# Patient Record
Sex: Male | Born: 2000 | Race: Black or African American | Hispanic: No | Marital: Single | Smoking: Never smoker
Health system: Southern US, Community
[De-identification: ages and names within clinical notes are randomized; demographics above are authoritative.]

---

## 2020-07-08 ENCOUNTER — Ambulatory Visit
Admission: RE | Admit: 2020-07-08 | Discharge: 2020-07-08 | Disposition: A | Discharge status: Home / Self Care | Payer: Self-pay | Source: Ambulatory Visit

## 2020-07-27 DIAGNOSIS — I82409 Acute embolism and thrombosis of unspecified deep veins of unspecified lower extremity: Secondary | ICD-10-CM

## 2020-07-27 HISTORY — DX: Acute embolism and thrombosis of unspecified deep veins of unspecified lower extremity: I82.409

## 2020-08-06 ENCOUNTER — Emergency Department: Payer: PRIVATE HEALTH INSURANCE

## 2020-08-06 ENCOUNTER — Other Ambulatory Visit: Payer: Self-pay

## 2020-08-06 ENCOUNTER — Emergency Department
Admission: EM | Admit: 2020-08-06 | Discharge: 2020-08-06 | Disposition: A | Discharge status: Home / Self Care | Payer: PRIVATE HEALTH INSURANCE | Attending: Emergency Medicine | Admitting: Emergency Medicine

## 2020-08-06 DIAGNOSIS — Z7901 Long term (current) use of anticoagulants: Secondary | ICD-10-CM | POA: Diagnosis not present

## 2020-08-06 DIAGNOSIS — I82452 Acute embolism and thrombosis of left peroneal vein: Secondary | ICD-10-CM | POA: Insufficient documentation

## 2020-08-06 DIAGNOSIS — Z20822 Contact with and (suspected) exposure to covid-19: Secondary | ICD-10-CM | POA: Diagnosis not present

## 2020-08-06 DIAGNOSIS — R21 Rash and other nonspecific skin eruption: Secondary | ICD-10-CM | POA: Diagnosis present

## 2020-08-06 LAB — COMPREHENSIVE METABOLIC PANEL
ALT: 20 U/L (ref 0–44)
AST: 34 U/L (ref 15–41)
Albumin: 4.8 g/dL (ref 3.5–5.0)
Alkaline Phosphatase: 61 U/L (ref 38–126)
Anion gap: 8 (ref 5–15)
BUN: 12 mg/dL (ref 6–20)
CO2: 30 mmol/L (ref 22–32)
Calcium: 9.7 mg/dL (ref 8.9–10.3)
Chloride: 101 mmol/L (ref 98–111)
Creatinine, Ser: 0.91 mg/dL (ref 0.61–1.24)
GFR, Estimated: >60 mL/min (ref >60)
Glucose, Bld: 91 mg/dL (ref 70–99)
Potassium: 4.2 mmol/L (ref 3.5–5.1)
Sodium: 139 mmol/L (ref 135–145)
Total Bilirubin: 0.7 mg/dL (ref 0.3–1.2)
Total Protein: 8.6 g/dL — ABNORMAL HIGH (ref 6.5–8.1)

## 2020-08-06 LAB — CBC WITH DIFFERENTIAL/PLATELET
Abs Immature Granulocytes: 0.03 10*3/uL (ref 0.00–0.07)
Basophils Absolute: 0 10*3/uL (ref 0.0–0.1)
Basophils Relative: 0 %
Eosinophils Absolute: 0 10*3/uL (ref 0.0–0.5)
Eosinophils Relative: 0 %
HCT: 47 % (ref 39.0–52.0)
Hemoglobin: 15.5 g/dL (ref 13.0–17.0)
Immature Granulocytes: 0 %
Lymphocytes Relative: 14 %
Lymphs Abs: 1.4 10*3/uL (ref 0.7–4.0)
MCH: 28.8 pg (ref 26.0–34.0)
MCHC: 33 g/dL (ref 30.0–36.0)
MCV: 87.4 fL (ref 80.0–100.0)
Monocytes Absolute: 0.4 10*3/uL (ref 0.1–1.0)
Monocytes Relative: 4 %
Neutro Abs: 8.5 10*3/uL — ABNORMAL HIGH (ref 1.7–7.7)
Neutrophils Relative %: 82 %
Platelets: 238 10*3/uL (ref 150–400)
RBC: 5.38 MIL/uL (ref 4.22–5.81)
RDW: 13.2 % (ref 11.5–15.5)
WBC: 10.4 10*3/uL (ref 4.0–10.5)
nRBC: 0 % (ref 0.0–0.2)

## 2020-08-06 LAB — RESPIRATORY PANEL BY RT PCR (FLU A&B, COVID)
Influenza A by PCR: NEGATIVE
Influenza B by PCR: NEGATIVE
SARS Coronavirus 2 by RT PCR: NEGATIVE

## 2020-08-06 MED ORDER — IOHEXOL 350 MG/ML SOLN
75.0000 mL | Freq: Once | INTRAVENOUS | Status: AC | PRN
  Administered 2020-08-06: 75 mL via INTRAVENOUS
  Filled 2020-08-06: qty 75

## 2020-08-06 MED ORDER — APIXABAN 5 MG PO TABS: ORAL_TABLET | ORAL | 6 refills | Status: AC

## 2020-08-06 NOTE — ED Provider Notes (Signed)
Emergency Department Provider Note  ____________________________________________  Time seen: Approximately 11:06 PM  I have reviewed the triage vital signs and the nursing notes.   HISTORY  Chief Complaint Rash (pt with rash to left lower leg only, itchy and painful)   Historian Patient   HPI Shawn Vargas is a 19 y.o. male presents to the emergency department with a rash along the left calf.  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  Patient states that after having seafood he noticed that his left calf appeared slightly more swollen than the right and he developed a papular, dry rash.  Patient states that he went to a local urgent care who gave him a steroid shot and started on prednisone.  He states that he has noticed no improvement in the rash or calf swelling.  Patient denies prolonged immobilization or recent surgery.  He denies daily smoking and has never had a DVT or PE.  He denies fever and chills.  He states that he has had cough for the past 2 to 3 days and has had 1-2 episodes of rust colored sputum.  Patient states that he has occasionally experience some shortness of breath.    No past medical history on file.   Immunizations up to date:  Yes.     No past medical history on file.  There are no problems to display for this patient.     Prior to Admission medications   Medication Sig Start Date End Date Taking? Authorizing Provider  apixaban (ELIQUIS) 5 MG TABS tablet Take 2 tablets morning and night for 7 days.  After 7 days take 1 tablet morning and night. 08/06/20   Orvil Feil, PA-C    Allergies Shellfish allergy  No family history on file.  Social History Social History   Tobacco Use   Smoking status: Not on file  Substance Use Topics   Alcohol use: Not on file   Drug use: Not on file     Review of Systems  Constitutional: No fever/chills Eyes:  No discharge ENT: No upper  respiratory complaints. Respiratory: no cough. No SOB/ use of accessory muscles to breath Gastrointestinal:   No nausea, no vomiting.  No diarrhea.  No constipation. Musculoskeletal: Patient has left calf pain.  Skin: Negative for rash, abrasions, lacerations, ecchymosis.   ____________________________________________   PHYSICAL EXAM:  VITAL SIGNS: ED Triage Vitals  Enc Vitals Group     BP 08/06/20 1859 (!) 147/86     Pulse Rate 08/06/20 1859 76     Resp 08/06/20 1859 (!) 24     Temp 08/06/20 1859 99.3 F (37.4 C)     Temp Source 08/06/20 1859 Oral     SpO2 08/06/20 1859 100 %     Weight 08/06/20 1900 217 lb (98.4 kg)     Height 08/06/20 1900 6\' 3"  (1.905 m)     Head Circumference --      Peak Flow --      Pain Score 08/06/20 1900 0     Pain Loc --      Pain Edu? --      Excl. in GC? --      Constitutional: Alert and oriented. Well appearing and in no acute distress. Eyes: Conjunctivae are normal. PERRL. EOMI. Head: Atraumatic. Cardiovascular: Normal rate, regular rhythm. Normal S1 and S2.  Good peripheral circulation. Respiratory: Normal respiratory effort without tachypnea or retractions. Lungs CTAB. Good air  entry to the bases with no decreased or absent breath sounds Gastrointestinal: Bowel sounds x 4 quadrants. Soft and nontender to palpation. No guarding or rigidity. No distention. Musculoskeletal: Full range of motion to all extremities. No obvious deformities noted Neurologic:  Normal for age. No gross focal neurologic deficits are appreciated.  Skin: Patient has dry, papular rash in an irregular distribution along lateral and anterior aspect of left calf.  Left calf does appear slightly larger than right with no pitting edema.  No increased warmth of the left calf in comparison with the right.  Palpable dorsalis pedis pulse bilaterally and symmetrically.  Capillary refill less than 2 seconds Psychiatric: Mood and affect are normal for age. Speech and behavior are  normal.   ____________________________________________   LABS (all labs ordered are listed, but only abnormal results are displayed)  Labs Reviewed  CBC WITH DIFFERENTIAL/PLATELET - Abnormal; Notable for the following components:      Result Value   Neutro Abs 8.5 (*)    All other components within normal limits  COMPREHENSIVE METABOLIC PANEL - Abnormal; Notable for the following components:   Total Protein 8.6 (*)    All other components within normal limits  RESPIRATORY PANEL BY RT PCR (FLU A&B, COVID)   ____________________________________________  EKG   ____________________________________________  RADIOLOGY Geraldo Pitter, personally viewed and evaluated these images (plain radiographs) as part of my medical decision making, as well as reviewing the written report by the radiologist.  DG Chest 2 View  Result Date: 08/06/2020 CLINICAL DATA:  19 year old male with calf swelling. EXAM: CHEST - 2 VIEW COMPARISON:  None. FINDINGS: The heart size and mediastinal contours are within normal limits. Both lungs are clear. The visualized skeletal structures are unremarkable. IMPRESSION: No active cardiopulmonary disease. Electronically Signed   By: Elgie Collard M.D.   On: 08/06/2020 20:58   CT Angio Chest PE W and/or Wo Contrast  Result Date: 08/06/2020 CLINICAL DATA:  Shortness of breath EXAM: CT ANGIOGRAPHY CHEST WITH CONTRAST TECHNIQUE: Multidetector CT imaging of the chest was performed using the standard protocol during bolus administration of intravenous contrast. Multiplanar CT image reconstructions and MIPs were obtained to evaluate the vascular anatomy. CONTRAST:  56mL OMNIPAQUE IOHEXOL 350 MG/ML SOLN COMPARISON:  None. FINDINGS: Cardiovascular: Contrast injection is sufficient to demonstrate satisfactory opacification of the pulmonary arteries to the segmental level. There is no pulmonary embolus or evidence of right heart strain. The size of the main pulmonary artery is  normal. Mild cardiomegaly. The course and caliber of the aorta are normal. There is no atherosclerotic calcification. Opacification decreased due to pulmonary arterial phase contrast bolus timing. Mediastinum/Nodes: -- No mediastinal lymphadenopathy. There is retrosternal soft tissues favored to represent residual thymic tissue. -- No hilar lymphadenopathy. -- No axillary lymphadenopathy. -- No supraclavicular lymphadenopathy. -- Normal thyroid gland where visualized. -  Unremarkable esophagus. Lungs/Pleura: Airways are patent. No pleural effusion, lobar consolidation, pneumothorax or pulmonary infarction. Upper Abdomen: Contrast bolus timing is not optimized for evaluation of the abdominal organs. The visualized portions of the organs of the upper abdomen are normal. Musculoskeletal: No chest wall abnormality. No bony spinal canal stenosis. Review of the MIP images confirms the above findings. IMPRESSION: 1. No evidence of pulmonary embolism or other acute intrathoracic process. 2. Mild cardiomegaly. Electronically Signed   By: Katherine Mantle M.D.   On: 08/06/2020 22:47   US Venous Img Lower Unilateral Left  Result Date: 08/06/2020 CLINICAL DATA:  Lower extremity swelling. EXAM: LEFT LOWER EXTREMITY VENOUS  DOPPLER ULTRASOUND TECHNIQUE: Gray-scale sonography with compression, as well as color and duplex ultrasound, were performed to evaluate the deep venous system(s) from the level of the common femoral vein through the popliteal and proximal calf veins. COMPARISON:  None. FINDINGS: VENOUS Normal compressibility of the common femoral, superficial femoral, and popliteal veins. The left posterior tibial vein is patent. There is occlusive thrombus within the left peroneal vein. Visualized portions of profunda femoral vein and great saphenous vein unremarkable. No filling defects to suggest DVT on grayscale or color Doppler imaging. Doppler waveforms show normal direction of venous flow, normal respiratory  plasticity and response to augmentation. Limited views of the contralateral common femoral vein are unremarkable. OTHER None. Limitations: none IMPRESSION: Study is positive for an acute DVT involving the left peroneal vein. Electronically Signed   By: Katherine Mantle M.D.   On: 08/06/2020 22:06    ____________________________________________    PROCEDURES  Procedure(s) performed:     Procedures     Medications  iohexol (OMNIPAQUE) 350 MG/ML injection 75 mL (75 mLs Intravenous Contrast Given 08/06/20 2231)     ____________________________________________   INITIAL IMPRESSION / ASSESSMENT AND PLAN / ED COURSE  Pertinent labs & imaging results that were available during my care of the patient were reviewed by me and considered in my medical decision making (see chart for details).  Clinical Course as of Aug 06 2305  Thu Aug 06, 2020  2232 CT Angio Chest PE W and/or Wo Contrast [JW]    Clinical Course User Index [JW] Orvil Feil, PA-C     Assessment and plan DVT 19 year old male presents to the emergency department with a dry rash and mild left calf swelling for the past 4 to 5 days.  Patient was tachypneic at triage but vital signs were otherwise reassuring.  Differential diagnosis included contact dermatitis, DVT, allergic reaction, folliculitis...  CBC and CMP were reassuring.  Patient was COVID-19 negative.  Obtain a venous ultrasound his left calf was slightly larger than right which revealed an occlusive thrombus in the left peroneal vein.  Because patient was having occasional shortness of breath and cough, obtain CTA which revealed no evidence of PE.  Patient was started on Eliquis.  I discussed patient's case with attending, Dr. Lenard Lance and we agreed to have patient follow-up with hematology oncology for further testing to identify underlying cause of DVT as patient does not have typical risk factors.  Return precautions were given to return with new  or worsening symptoms.  All patient questions were answered.    ____________________________________________  FINAL CLINICAL IMPRESSION(S) / ED DIAGNOSES  Final diagnoses:  Acute deep vein thrombosis (DVT) of left peroneal vein (HCC)      NEW MEDICATIONS STARTED DURING THIS VISIT:  ED Discharge Orders         Ordered    apixaban (ELIQUIS) 5 MG TABS tablet        08/06/20 2302              This chart was dictated using voice recognition software/Dragon. Despite best efforts to proofread, errors can occur which can change the meaning. Any change was purely unintentional.      Orvil Feil, PA-C 08/06/20 2311    Minna Antis, MD 08/06/20 [phone]

## 2020-08-06 NOTE — ED Notes (Signed)
Patient transported to CT 

## 2020-08-06 NOTE — Discharge Instructions (Addendum)
You are going to take 2 tablets of Eliquis in the morning and 2 tablets of Eliquis at night for the first 7 days.  After 7 days take 1 tablet of Eliquis in the morning and 1 tablet of Eliquis at night. Please make follow-up appointment with hematology.

## 2020-08-06 NOTE — ED Triage Notes (Signed)
Pt with possible exposure to seafood, pt has severe seafood allergy. Pt states his throat and eyes swell with exposure. Pt states he had possible exposure last Thursday, had SOB and hives, went to urgent care and got steroid shot and prednisone. Pt states he still feels SOB and has hives. Pt thinks he coughed up blood this morning.

## 2020-08-07 ENCOUNTER — Other Ambulatory Visit: Payer: Self-pay

## 2020-08-07 ENCOUNTER — Encounter: Payer: Self-pay | Admitting: Emergency Medicine

## 2020-08-07 DIAGNOSIS — M79622 Pain in left upper arm: Secondary | ICD-10-CM | POA: Insufficient documentation

## 2020-08-07 DIAGNOSIS — Z7901 Long term (current) use of anticoagulants: Secondary | ICD-10-CM | POA: Diagnosis not present

## 2020-08-07 DIAGNOSIS — Z86718 Personal history of other venous thrombosis and embolism: Secondary | ICD-10-CM | POA: Insufficient documentation

## 2020-08-07 NOTE — ED Triage Notes (Signed)
Pt presents to ER with complaints of left arm pain. Pt reports he developed pain today reports diagnosed yesterday with blood clot to left lower leg. Reports took Eliquis first dose prior to arrival. Pt has palpable pulse to left wrist. Pt talks in complete sentences no distress noted

## 2020-08-08 ENCOUNTER — Emergency Department
Admission: EM | Admit: 2020-08-08 | Discharge: 2020-08-08 | Disposition: A | Discharge status: Home / Self Care | Payer: PRIVATE HEALTH INSURANCE | Attending: Emergency Medicine | Admitting: Emergency Medicine

## 2020-08-08 ENCOUNTER — Emergency Department: Payer: PRIVATE HEALTH INSURANCE

## 2020-08-08 DIAGNOSIS — S46212A Strain of muscle, fascia and tendon of other parts of biceps, left arm, initial encounter: Secondary | ICD-10-CM

## 2020-08-08 DIAGNOSIS — M7989 Other specified soft tissue disorders: Secondary | ICD-10-CM

## 2020-08-08 DIAGNOSIS — M79622 Pain in left upper arm: Secondary | ICD-10-CM

## 2020-08-08 NOTE — ED Provider Notes (Signed)
[facility] Emergency Department Provider Note   ____________________________________________   First MD Initiated Contact with Patient 08/08/20 [phone]     (approximate)  I have reviewed the triage vital signs and the nursing notes.   HISTORY  Chief Complaint Arm Pain (Left )    HPI Shawn Vargas is a 19 y.o. male with past medical history of DVT on Eliquis who presents to the ED complaining of arm pain.  Patient reports that over the past 12 hours he has noticed some pain and swelling in his left arm.  Symptoms are similar to what he has recently been experiencing in his left leg, and he was diagnosed with DVT in his left lower extremity on recent ED visit.  He had CTA at that time that was negative for PE, states he has been compliant with Eliquis since discharge home.  He denies any trauma to his left arm, has not had any recent heavy lifting.  Pain is primarily over the area of his biceps when he goes to flex his left elbow.  He has not noticed any wounds or drainage.        History reviewed. No pertinent past medical history.  There are no problems to display for this patient.   History reviewed. No pertinent surgical history.  Prior to Admission medications   Medication Sig Start Date End Date Taking? Authorizing Provider  apixaban (ELIQUIS) 5 MG TABS tablet Take 2 tablets morning and night for 7 days.  After 7 days take 1 tablet morning and night. 08/06/20   Orvil Feil, PA-C    Allergies Shellfish allergy  No family history on file.  Social History Social History   Tobacco Use  . Smoking status: Never Smoker  Substance Use Topics  . Alcohol use: Not on file  . Drug use: Not on file    Review of Systems  Constitutional: No fever/chills Eyes: No visual changes. ENT: No sore throat. Cardiovascular: Denies chest pain. Respiratory: Denies shortness of breath. Gastrointestinal: No abdominal pain.  No nausea, no vomiting.  No  diarrhea.  No constipation. Genitourinary: Negative for dysuria. Musculoskeletal: Negative for back pain.  Positive for left arm pain. Skin: Negative for rash. Neurological: Negative for headaches, focal weakness or numbness.  ____________________________________________   PHYSICAL EXAM:  VITAL SIGNS: ED Triage Vitals  Enc Vitals Group     BP 08/07/20 2356 130/67     Pulse Rate 08/07/20 2356 64     Resp 08/07/20 2356 20     Temp 08/07/20 2356 98.9 F (37.2 C)     Temp Source 08/07/20 2356 Oral     SpO2 08/07/20 2356 97 %     Weight 08/07/20 2357 217 lb (98.4 kg)     Height 08/07/20 2357 6\' 3"  (1.905 m)     Head Circumference --      Peak Flow --      Pain Score 08/07/20 2356 6     Pain Loc --      Pain Edu? --      Excl. in GC? --     Constitutional: Alert and oriented. Eyes: Conjunctivae are normal. Head: Atraumatic. Nose: No congestion/rhinnorhea. Mouth/Throat: Mucous membranes are moist. Neck: Normal ROM Cardiovascular: Normal rate, regular rhythm. Grossly normal heart sounds. Respiratory: Normal respiratory effort.  No retractions. Lungs CTAB. Gastrointestinal: Soft and nontender. No distention. Genitourinary: deferred Musculoskeletal: No lower extremity tenderness nor edema.  Tenderness to palpation over left bicep, no associated erythema, warmth, or fluctuance.  Range of motion to bilateral upper and lower extremities intact without pain. Neurologic:  Normal speech and language. No gross focal neurologic deficits are appreciated. Skin:  Skin is warm, dry and intact. No rash noted. Psychiatric: Mood and affect are normal. Speech and behavior are normal.  ____________________________________________   LABS (all labs ordered are listed, but only abnormal results are displayed)  Labs Reviewed - No data to display   PROCEDURES  Procedure(s) performed (including Critical Care):  Procedures   ____________________________________________   INITIAL  IMPRESSION / ASSESSMENT AND PLAN / ED COURSE       19 year old male with possible history of DVT on Eliquis who presents to the ED complaining of left arm pain and swelling for the past 12 hours.  He is primarily concerned for DVT in his left arm as he was recently diagnosed with 1 in his left lower extremity.  Ultrasound was performed and negative for DVT.  No evidence of traumatic injury to his left arm and no signs of abscess or cellulitis.  He does have tenderness at his left bicep as well as pain with left elbow flexion.  This appears most consistent with a bicep strain and patient is appropriate for discharge home with hematology and PCP follow-up.  He was counseled to avoid NSAIDs given he is anticoagulated, but may take Tylenol as needed and apply ice.  He was counseled to return to the ED for new or worsening symptoms, patient agrees with plan.      ____________________________________________   FINAL CLINICAL IMPRESSION(S) / ED DIAGNOSES  Final diagnoses:  Left upper arm pain  Left arm swelling     ED Discharge Orders    None       Note:  This document was prepared using Dragon voice recognition software and may include unintentional dictation errors.   Chesley Noon, MD 08/08/20 [phone]

## 2020-08-12 ENCOUNTER — Inpatient Hospital Stay: Payer: PRIVATE HEALTH INSURANCE | Admitting: Oncology

## 2020-08-12 ENCOUNTER — Inpatient Hospital Stay: Payer: PRIVATE HEALTH INSURANCE

## 2020-08-13 ENCOUNTER — Other Ambulatory Visit: Payer: Self-pay

## 2020-08-13 ENCOUNTER — Encounter: Payer: Self-pay | Admitting: Oncology

## 2020-08-13 ENCOUNTER — Inpatient Hospital Stay: Payer: PRIVATE HEALTH INSURANCE | Attending: Oncology | Admitting: Oncology

## 2020-08-13 ENCOUNTER — Inpatient Hospital Stay: Payer: PRIVATE HEALTH INSURANCE

## 2020-08-13 VITALS — BP 123/77 | HR 61 | Temp 96.2°F | Resp 16 | Wt 218.0 lb

## 2020-08-13 DIAGNOSIS — D6861 Antiphospholipid syndrome: Secondary | ICD-10-CM | POA: Diagnosis not present

## 2020-08-13 DIAGNOSIS — R21 Rash and other nonspecific skin eruption: Secondary | ICD-10-CM

## 2020-08-13 DIAGNOSIS — Z7901 Long term (current) use of anticoagulants: Secondary | ICD-10-CM | POA: Diagnosis not present

## 2020-08-13 DIAGNOSIS — I82452 Acute embolism and thrombosis of left peroneal vein: Secondary | ICD-10-CM

## 2020-08-13 DIAGNOSIS — D6851 Activated protein C resistance: Secondary | ICD-10-CM | POA: Insufficient documentation

## 2020-08-13 LAB — HEPATITIS PANEL, ACUTE
HCV Ab: NONREACTIVE
Hep A IgM: NONREACTIVE
Hep B C IgM: NONREACTIVE
Hepatitis B Surface Ag: NONREACTIVE

## 2020-08-13 LAB — HIV ANTIBODY (ROUTINE TESTING W REFLEX): HIV Screen 4th Generation wRfx: NONREACTIVE

## 2020-08-13 NOTE — Progress Notes (Signed)
Hematology/Oncology Consult note [facility] Telephone:([phone] [phone]   Patient Care Team: Patient, No Pcp Per as PCP - General (General Practice)  REFERRING PROVIDER: Minna Antis, MD  CHIEF COMPLAINTS/REASON FOR VISIT:  Evaluation of left lower extremity acute DVT  HISTORY OF PRESENTING ILLNESS:   Shawn Vargas is a  19 y.o.  male with PMH listed below was seen in consultation at the request of  Minna Antis, MD  for evaluation of left lower extremity acute DVT  08/02/2020, patient developed rash of left lower extremity.  Initially he thought was allergic reaction after eating past which initially was thought to be secondary to allergic reaction from seafood.  Patient went to urgent care and had steroid shot. 08/06/2020 patient went to emergency room for evaluation of left calf swelling and the pain.  Patient also had a cough for about 2 to 3 days and noticed 1-2 episodes of rust colored sputum. Left lower extremity venous ultrasound showed occlusive thrombus in the left peroneal vein.   CT angiogram showed no pulmonary embolism.  Mild cardiomegaly Patient denies any trauma, recent immobilization events. Patient was started on Eliquis starter kit. 08/08/2020, patient presented emergency room again after noticing left upper extremity rash.  Venous ultrasound of the left upper extremity showed no DVT.  Patient was referred to hematology for further evaluation.  Today patient reports being on Eliquis 10 mg twice daily.  He is about to finish the initial course and going to switch to Eliquis 5 mg twice daily. He reports tolerating treatments very well.  No bleeding.  Left lower extremity calf pain and swelling has slightly improved.  Rash is persist.  He denies any family history or personal history of VTE.  Denies smoking. He has large area of tattoo on his left lower extremity as well as left upper extremity.  Some area of the tattoo  was recently done approximately a month ago.   Review of Systems  Constitutional: Negative for appetite change, chills, fatigue, fever and unexpected weight change.  HENT:   Negative for hearing loss and voice change.   Eyes: Negative for eye problems and icterus.  Respiratory: Negative for chest tightness, cough and shortness of breath.   Cardiovascular: Negative for chest pain and leg swelling.  Gastrointestinal: Negative for abdominal distention and abdominal pain.  Endocrine: Negative for hot flashes.  Genitourinary: Negative for difficulty urinating, dysuria and frequency.   Musculoskeletal: Negative for arthralgias.  Skin: Positive for rash. Negative for itching.  Neurological: Negative for light-headedness and numbness.  Hematological: Negative for adenopathy. Does not bruise/bleed easily.  Psychiatric/Behavioral: Negative for confusion.    MEDICAL HISTORY:  Past Medical History:  Diagnosis Date  . DVT (deep venous thrombosis) (HCC) 07/2020    SURGICAL HISTORY: History reviewed. No pertinent surgical history.  SOCIAL HISTORY: Social History   Socioeconomic History  . Marital status: Single    Spouse name: Not on file  . Number of children: Not on file  . Years of education: Not on file  . Highest education level: Not on file  Occupational History  . Not on file  Tobacco Use  . Smoking status: Never Smoker  . Smokeless tobacco: Never Used  Vaping Use  . Vaping Use: Never used  Substance and Sexual Activity  . Alcohol use: Not Currently  . Drug use: Not Currently  . Sexual activity: Not on file  Other Topics Concern  . Not on file  Social History Narrative  . Not on file  Social Determinants of Health   Financial Resource Strain:   . Difficulty of Paying Living Expenses: Not on file  Food Insecurity:   . Worried About Charity fundraiser in the Last Year: Not on file  . Ran Out of Food in the Last Year: Not on file  Transportation Needs:   . Lack of  Transportation (Medical): Not on file  . Lack of Transportation (Non-Medical): Not on file  Physical Activity:   . Days of Exercise per Week: Not on file  . Minutes of Exercise per Session: Not on file  Stress:   . Feeling of Stress : Not on file  Social Connections:   . Frequency of Communication with Friends and Family: Not on file  . Frequency of Social Gatherings with Friends and Family: Not on file  . Attends Religious Services: Not on file  . Active Member of Clubs or Organizations: Not on file  . Attends Archivist Meetings: Not on file  . Marital Status: Not on file  Intimate Partner Violence:   . Fear of Current or Ex-Partner: Not on file  . Emotionally Abused: Not on file  . Physically Abused: Not on file  . Sexually Abused: Not on file    FAMILY HISTORY: Family History  Problem Relation Age of Onset  . Hypertension Mother   . 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  MEDICATIONS:  Current Outpatient Medications  Medication Sig Dispense Refill  . apixaban (ELIQUIS) 5 MG TABS tablet Take 2 tablets morning and night for 7 days.  After 7 days take 1 tablet morning and night. 60 tablet 6   No current facility-administered medications for this visit.     PHYSICAL EXAMINATION: ECOG PERFORMANCE STATUS: 0 - Asymptomatic Vitals:   08/13/20 1423  BP: 123/77  Pulse: 61  Resp: 16  Temp: (!) 96.2 F (35.7 C)  SpO2: 100%   Filed Weights   08/13/20 1423  Weight: 218 lb (98.9 kg)    Physical Exam Constitutional:      General: He is not in acute distress. HENT:     Head: Normocephalic and atraumatic.  Eyes:     General: No scleral icterus. Cardiovascular:     Rate and Rhythm: Normal rate and regular rhythm.     Heart sounds: Normal heart sounds.  Pulmonary:     Effort: Pulmonary effort is normal. No respiratory distress.     Breath sounds: No wheezing.  Abdominal:     General: Bowel sounds are normal. There is no  distension.     Palpations: Abdomen is soft.  Musculoskeletal:        General: No deformity. Normal range of motion.     Cervical back: Normal range of motion and neck supple.  Skin:    General: Skin is warm and dry.     Findings: No erythema or rash.  Neurological:     Mental Status: He is alert and oriented to person, place, and time. Mental status is at baseline.     Cranial Nerves: No cranial nerve deficit.     Coordination: Coordination normal.  Psychiatric:        Mood and Affect: Mood normal.     LABORATORY DATA:  I have reviewed the data as listed Lab Results  Component Value Date   WBC 10.4 08/06/2020   HGB 15.5 08/06/2020   HCT 47.0 08/06/2020   MCV 87.4 08/06/2020   PLT 238 08/06/2020  Recent Labs    08/06/20 2022  NA 139  K 4.2  CL 101  CO2 30  GLUCOSE 91  BUN 12  CREATININE 0.91  CALCIUM 9.7  GFRNONAA >60  PROT 8.6*  ALBUMIN 4.8  AST 34  ALT 20  ALKPHOS 61  BILITOT 0.7   Iron/TIBC/Ferritin/ %Sat No results found for: IRON, TIBC, FERRITIN, IRONPCTSAT    RADIOGRAPHIC STUDIES: I have personally reviewed the radiological images as listed and agreed with the findings in the report. DG Chest 2 View  Result Date: 08/06/2020 CLINICAL DATA:  19 year old male with calf swelling. EXAM: CHEST - 2 VIEW COMPARISON:  None. FINDINGS: The heart size and mediastinal contours are within normal limits. Both lungs are clear. The visualized skeletal structures are unremarkable. IMPRESSION: No active cardiopulmonary disease. Electronically Signed   By: Anner Crete M.D.   On: 08/06/2020 20:58   CT Angio Chest PE W and/or Wo Contrast  Result Date: 08/06/2020 CLINICAL DATA:  Shortness of breath EXAM: CT ANGIOGRAPHY CHEST WITH CONTRAST TECHNIQUE: Multidetector CT imaging of the chest was performed using the standard protocol during bolus administration of intravenous contrast. Multiplanar CT image reconstructions and MIPs were obtained to evaluate the vascular  anatomy. CONTRAST:  63mL OMNIPAQUE IOHEXOL 350 MG/ML SOLN COMPARISON:  None. FINDINGS: Cardiovascular: Contrast injection is sufficient to demonstrate satisfactory opacification of the pulmonary arteries to the segmental level. There is no pulmonary embolus or evidence of right heart strain. The size of the main pulmonary artery is normal. Mild cardiomegaly. The course and caliber of the aorta are normal. There is no atherosclerotic calcification. Opacification decreased due to pulmonary arterial phase contrast bolus timing. Mediastinum/Nodes: -- No mediastinal lymphadenopathy. There is retrosternal soft tissues favored to represent residual thymic tissue. -- No hilar lymphadenopathy. -- No axillary lymphadenopathy. -- No supraclavicular lymphadenopathy. -- Normal thyroid gland where visualized. -  Unremarkable esophagus. Lungs/Pleura: Airways are patent. No pleural effusion, lobar consolidation, pneumothorax or pulmonary infarction. Upper Abdomen: Contrast bolus timing is not optimized for evaluation of the abdominal organs. The visualized portions of the organs of the upper abdomen are normal. Musculoskeletal: No chest wall abnormality. No bony spinal canal stenosis. Review of the MIP images confirms the above findings. IMPRESSION: 1. No evidence of pulmonary embolism or other acute intrathoracic process. 2. Mild cardiomegaly. Electronically Signed   By: Constance Holster M.D.   On: 08/06/2020 22:47   US Venous Img Lower Unilateral Left  Result Date: 08/06/2020 CLINICAL DATA:  Lower extremity swelling. EXAM: LEFT LOWER EXTREMITY VENOUS DOPPLER ULTRASOUND TECHNIQUE: Gray-scale sonography with compression, as well as color and duplex ultrasound, were performed to evaluate the deep venous system(s) from the level of the common femoral vein through the popliteal and proximal calf veins. COMPARISON:  None. FINDINGS: VENOUS Normal compressibility of the common femoral, superficial femoral, and popliteal veins. The  left posterior tibial vein is patent. There is occlusive thrombus within the left peroneal vein. Visualized portions of profunda femoral vein and great saphenous vein unremarkable. No filling defects to suggest DVT on grayscale or color Doppler imaging. Doppler waveforms show normal direction of venous flow, normal respiratory plasticity and response to augmentation. Limited views of the contralateral common femoral vein are unremarkable. OTHER None. Limitations: none IMPRESSION: Study is positive for an acute DVT involving the left peroneal vein. Electronically Signed   By: Constance Holster M.D.   On: 08/06/2020 22:06   US Venous Img Upper Uni Left (DVT)  Result Date: 08/08/2020 CLINICAL DATA:  Arm pain and  swelling EXAM: LEFT UPPER EXTREMITY VENOUS DOPPLER ULTRASOUND TECHNIQUE: Gray-scale sonography with graded compression, as well as color Doppler and duplex ultrasound were performed to evaluate the upper extremity deep venous system from the level of the subclavian vein and including the jugular, axillary, basilic, radial, ulnar and upper cephalic vein. Spectral Doppler was utilized to evaluate flow at rest and with distal augmentation maneuvers. COMPARISON:  None. FINDINGS: Contralateral Subclavian Vein: Respiratory phasicity is normal and symmetric with the symptomatic side. No evidence of thrombus. Normal compressibility. Internal Jugular Vein: No evidence of thrombus. Normal compressibility, respiratory phasicity and response to augmentation. Subclavian Vein: No evidence of thrombus. Normal compressibility, respiratory phasicity and response to augmentation. Axillary Vein: No evidence of thrombus. Normal compressibility, respiratory phasicity and response to augmentation. Cephalic Vein: No evidence of thrombus. Normal compressibility, respiratory phasicity and response to augmentation. Basilic Vein: No evidence of thrombus. Normal compressibility, respiratory phasicity and response to augmentation.  Brachial Veins: No evidence of thrombus. Normal compressibility, respiratory phasicity and response to augmentation. Radial Veins: No evidence of thrombus. Normal compressibility, respiratory phasicity and response to augmentation. Ulnar Veins: No evidence of thrombus. Normal compressibility, respiratory phasicity and response to augmentation. Other Findings:  None visualized. IMPRESSION: No evidence of DVT within the left upper extremity. Electronically Signed   By: Constance Holster M.D.   On: 08/08/2020 01:56      ASSESSMENT & PLAN:  1. Acute deep vein thrombosis (DVT) of left peroneal vein (HCC)   2. Rash    #Acute left lower extremity distal DVT Agree with anticoagulation with Eliquis, recommend 3-6 months anticoagulation.  Provoke vs unprovoked events The only identifiable factor is his recent tatoo procedure.  Hypercoagulable work up, Factor 5 leiden, prothrombin, antiphospholipid antibody syndrome.  I will hold of protein C, S and antithrombin level during the acute DVT setting. Will check in the future.   Rash, unclear if this is due to allergic reaction, vs tatto reaction, vs vasculitis, etc.  Check ANA, hepatitis, HIV  Orders Placed This Encounter  Procedures  . Prothrombin gene mutation    Standing Status:   Future    Number of Occurrences:   1    Standing Expiration Date:   08/13/2021  . ANTIPHOSPHOLIPID SYNDROME PROF    Standing Status:   Future    Number of Occurrences:   1    Standing Expiration Date:   08/13/2021  . Factor 5 leiden    Standing Status:   Future    Number of Occurrences:   1    Standing Expiration Date:   08/13/2021  . ANA w/Reflex    Standing Status:   Future    Number of Occurrences:   1    Standing Expiration Date:   08/13/2021  . Hepatitis panel, acute    Standing Status:   Future    Number of Occurrences:   1    Standing Expiration Date:   08/13/2021  . HIV Antibody (routine testing w rflx)    Standing Status:   Future    Number of  Occurrences:   1    Standing Expiration Date:   08/13/2021    All questions were answered. The patient knows to call the clinic with any problems questions or concerns.  cc Harvest Dark, MD    Return of visit: 3 months.  Thank you for this kind referral and the opportunity to participate in the care of this patient. A copy of today's note is routed to referring provider  Earlie Server, MD, PhD Hematology Oncology [facility] Pager- [phone] 08/13/2020

## 2020-08-13 NOTE — Progress Notes (Signed)
Pt here to establish care for acute embolism and thrombosis of left peroneal vein. Pt currently on eliquis 5mg  BID and will start on Eliquis 5mg  daily tomorrow.

## 2020-08-14 LAB — ANA W/REFLEX: Anti Nuclear Antibody (ANA): NEGATIVE

## 2020-08-17 LAB — DRVVT MIX: dRVVT Mix: 44.1 s — ABNORMAL HIGH (ref 0.0–40.4)

## 2020-08-17 LAB — ANTIPHOSPHOLIPID SYNDROME PROF
Anticardiolipin IgG: <9 GPL U/mL (ref 0–14)
Anticardiolipin IgM: 14 MPL U/mL — ABNORMAL HIGH (ref 0–12)
DRVVT: 52.3 s — ABNORMAL HIGH (ref 0.0–47.0)
PTT Lupus Anticoagulant: 39.2 s (ref 0.0–51.9)

## 2020-08-17 LAB — DRVVT CONFIRM: dRVVT Confirm: 1 ratio (ref 0.8–1.2)

## 2020-08-19 LAB — FACTOR 5 LEIDEN

## 2020-08-19 LAB — PROTHROMBIN GENE MUTATION

## 2020-11-17 ENCOUNTER — Inpatient Hospital Stay: Payer: PRIVATE HEALTH INSURANCE

## 2020-11-17 ENCOUNTER — Inpatient Hospital Stay: Payer: PRIVATE HEALTH INSURANCE | Attending: Oncology | Admitting: Oncology

## 2021-12-21 IMAGING — CR DG CHEST 2V
2 series · 2 of 2 positions shown · non-contrast
Comparison: None.

CLINICAL DATA: 19-year-old male with calf swelling.

EXAM:
CHEST - 2 VIEW

[chest pa]
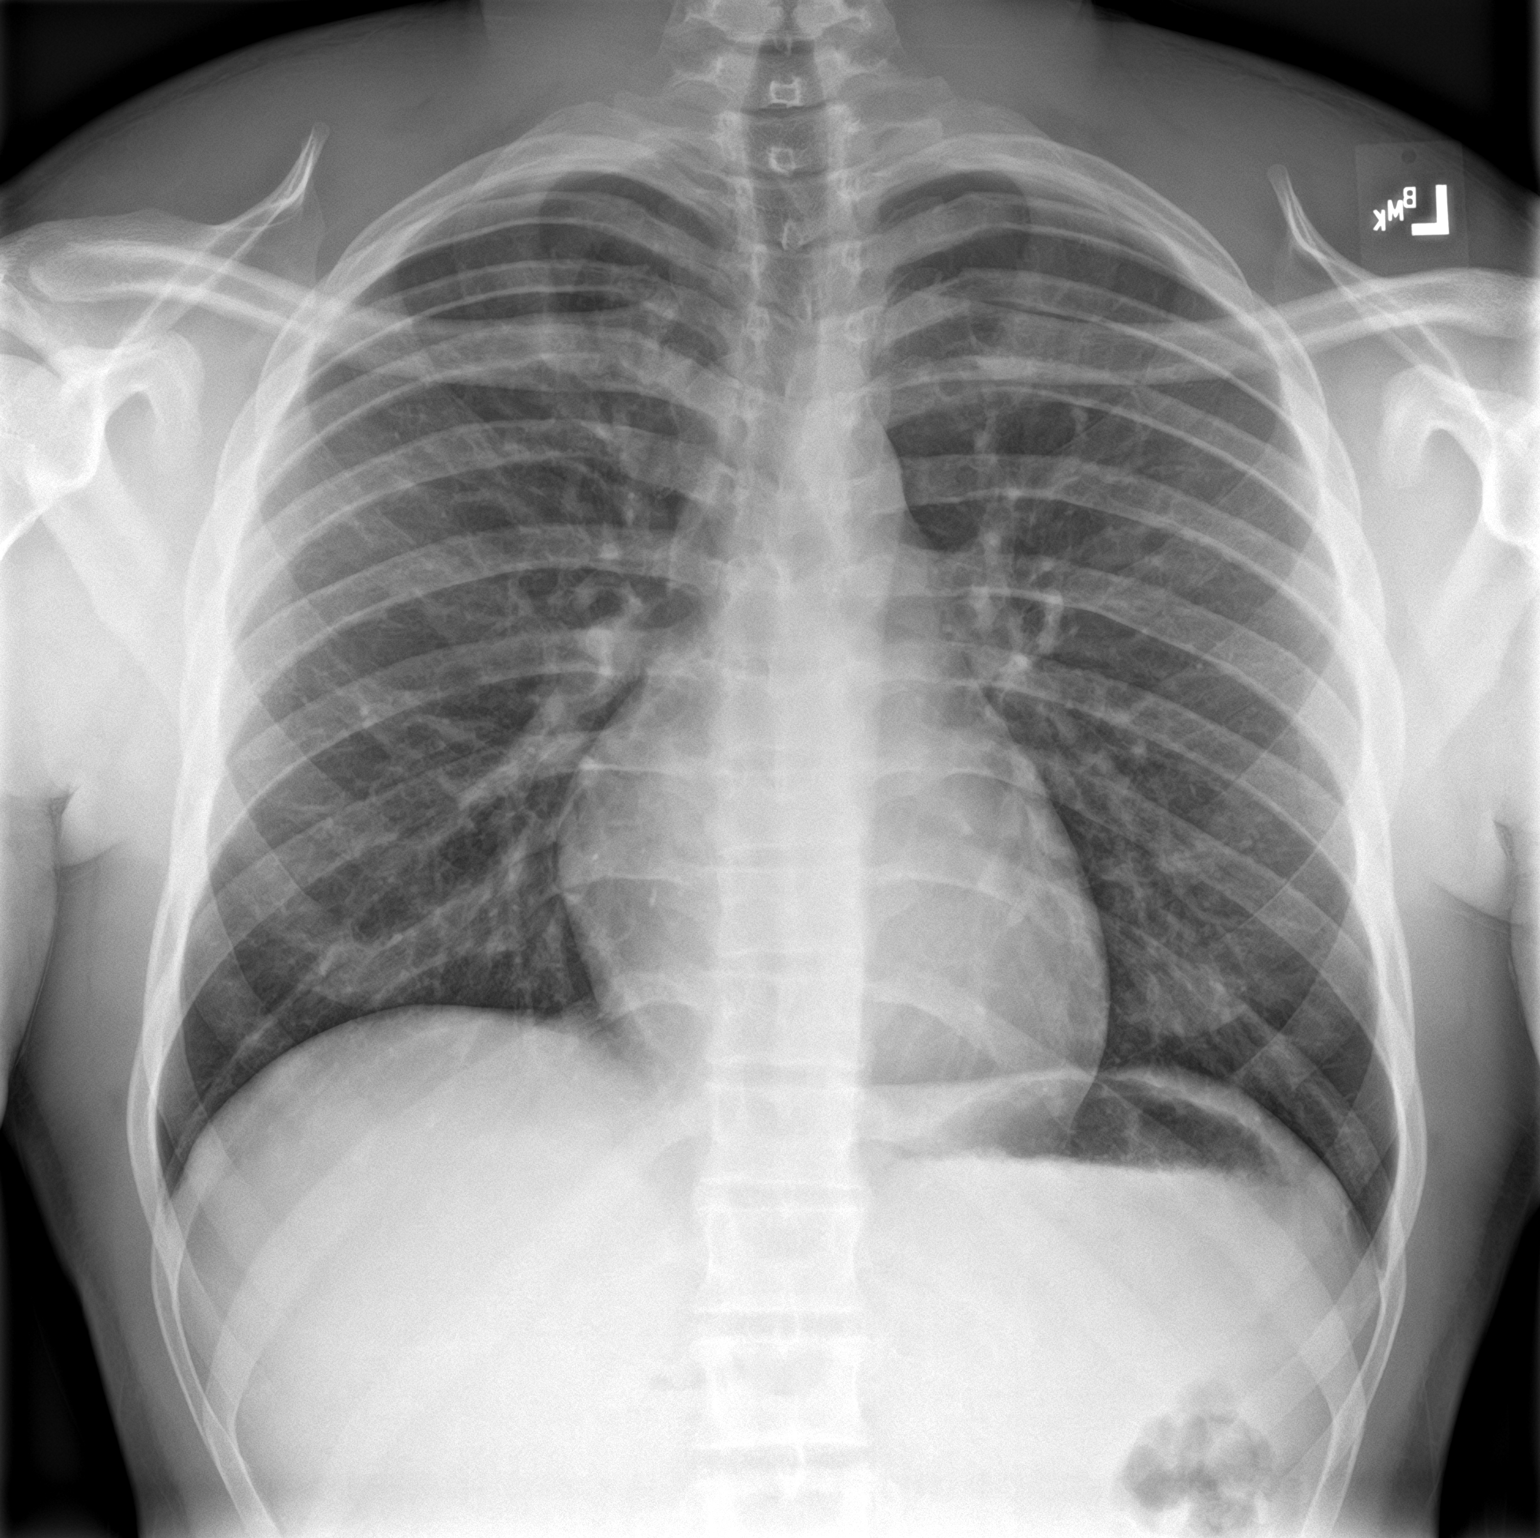

[chest lat]
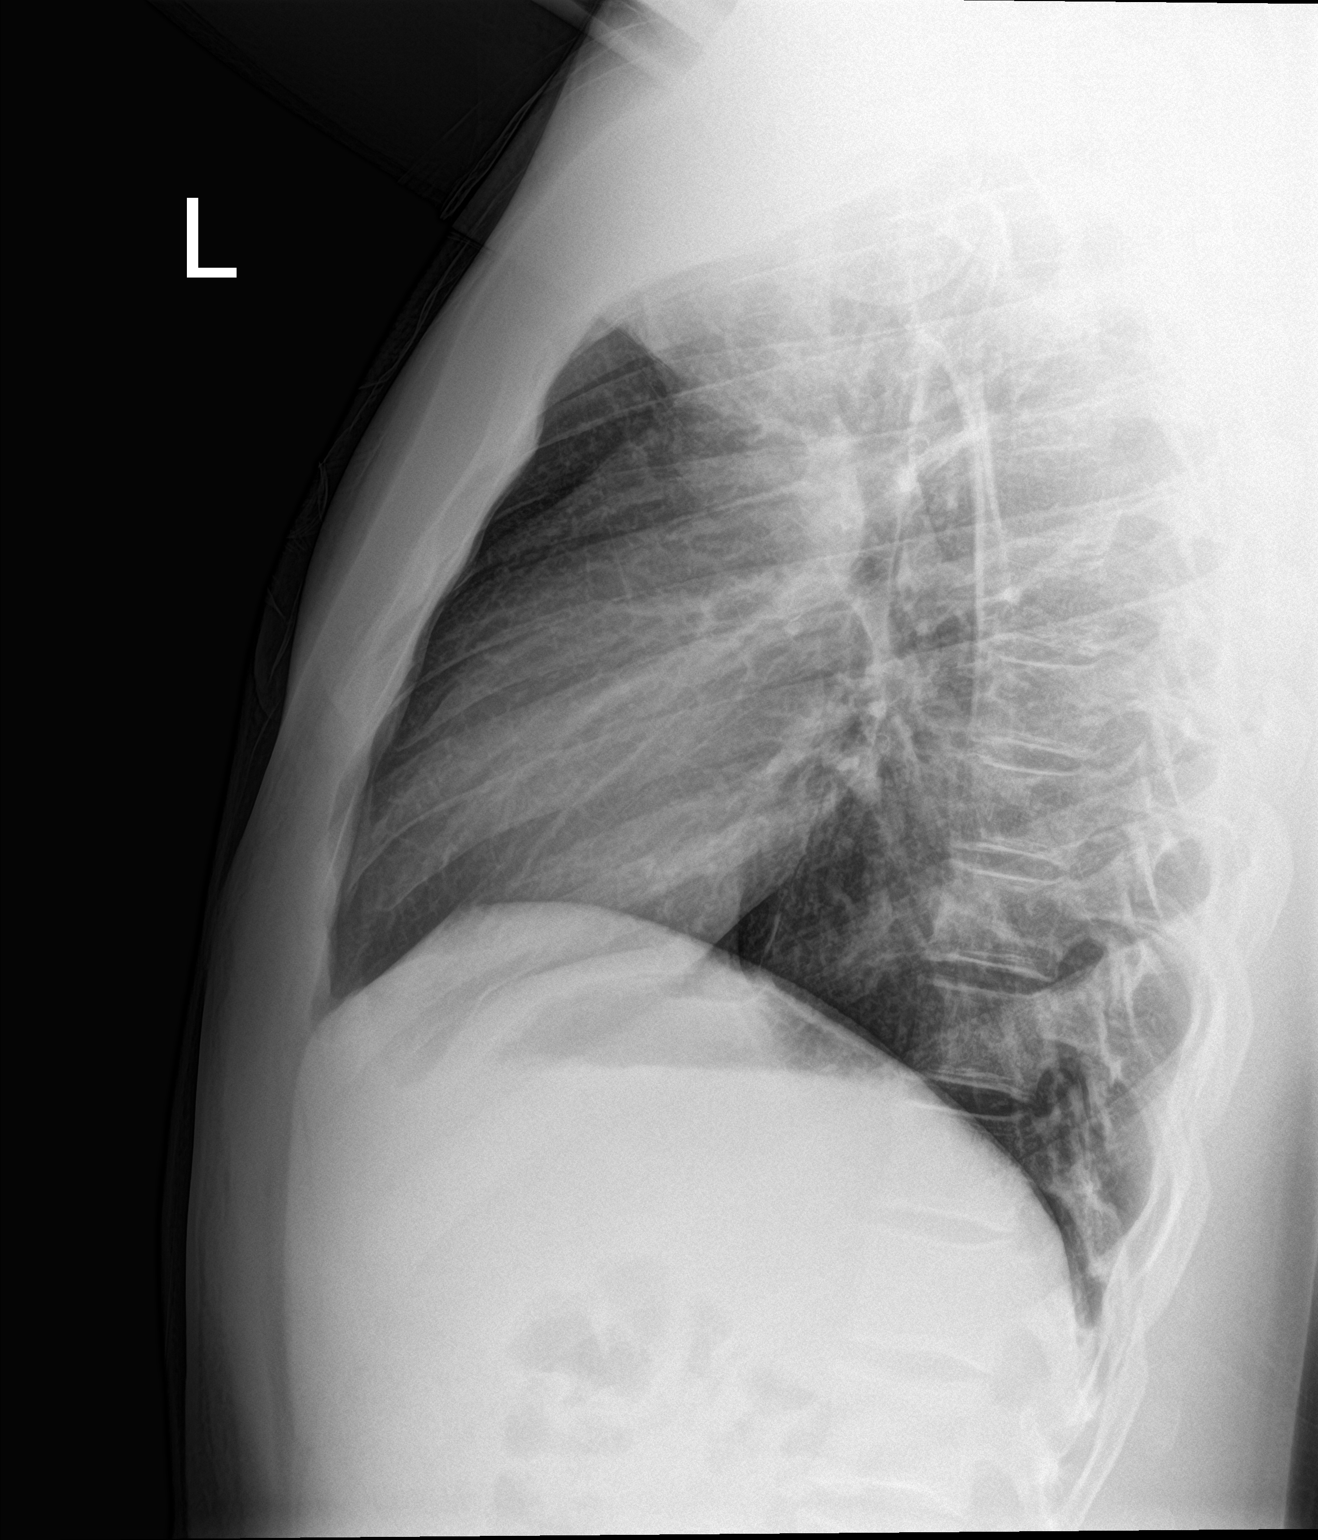

[2 of 2 positions shown; findings below may reference images not displayed]

FINDINGS: The heart size and mediastinal contours are within normal limits.
Both lungs are clear. The visualized skeletal structures are
unremarkable.
IMPRESSION: No active cardiopulmonary disease.

## 2021-12-21 IMAGING — CT CT ANGIO CHEST
2 of 6 series · 18 of 46 positions shown · IV contrast (omnipaque)
Comparison: None.

CLINICAL DATA: Shortness of breath

EXAM:
CT ANGIOGRAPHY CHEST WITH CONTRAST
TECHNIQUE: Multidetector CT imaging of the chest was performed using the
standard protocol during bolus administration of intravenous
contrast. Multiplanar CT image reconstructions and MIPs were
obtained to evaluate the vascular anatomy.
CONTRAST:  75mL OMNIPAQUE IOHEXOL 350 MG/ML SOLN

[Series 5: thins · axial · 0.73mm/px · z∈[-290,-37]mm · 15 of 279 slices shown]
[im 13/279  lung]
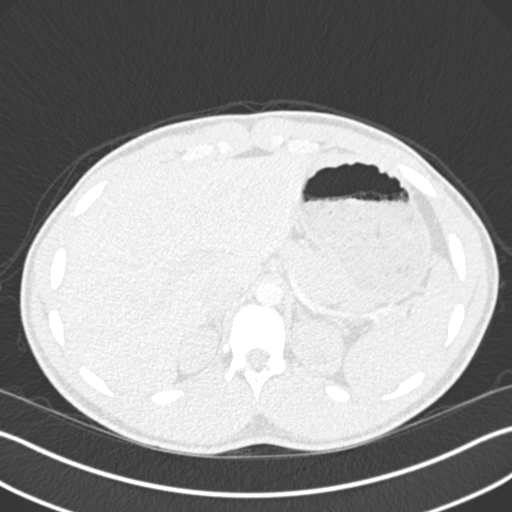
[im 37/279  soft-tissue]
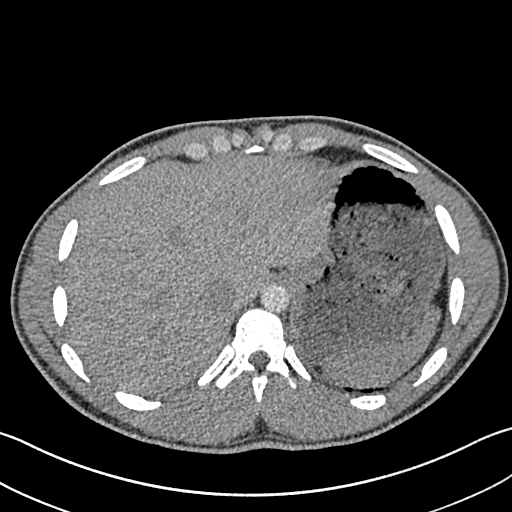
[im 49/279  lung]
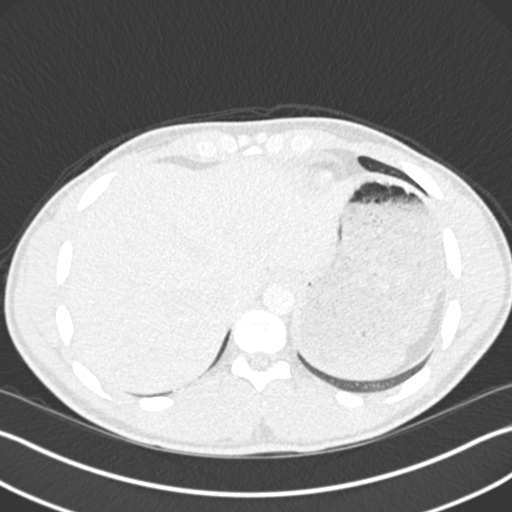
[im 73/279  soft-tissue]
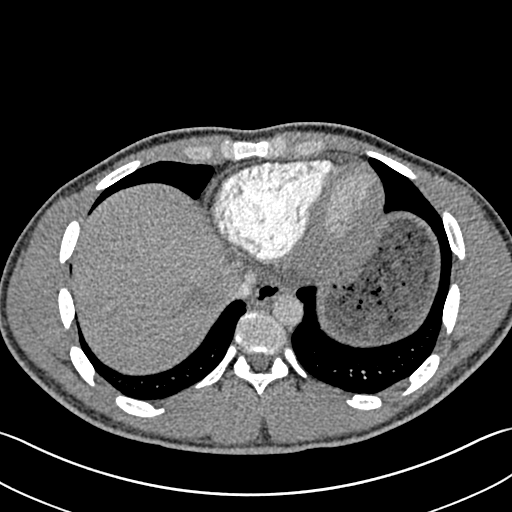
[im 85/279  lung]
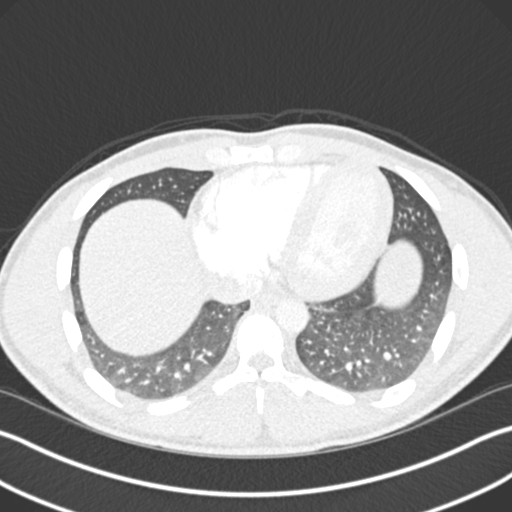
[im 109/279  soft-tissue]
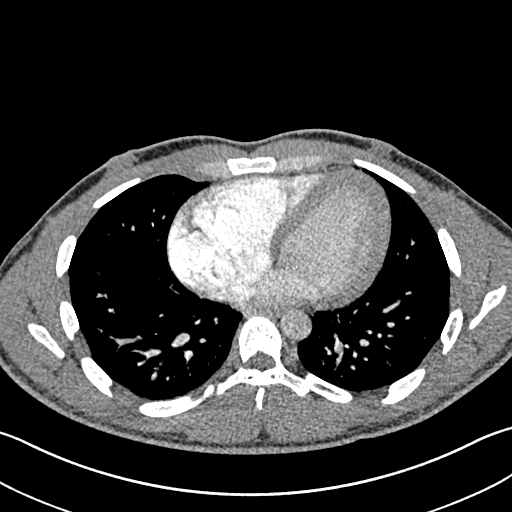
[im 121/279  lung]
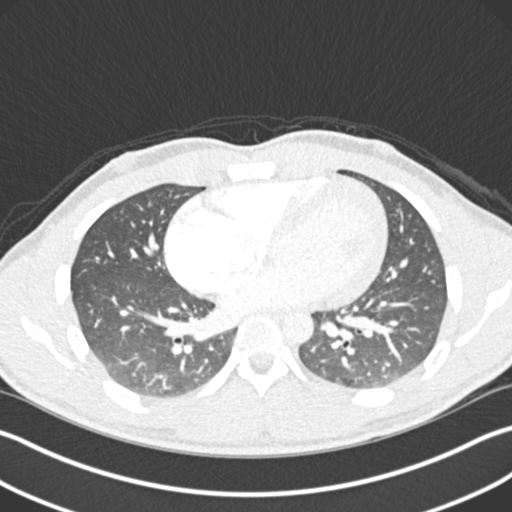
[im 146/279  soft-tissue]
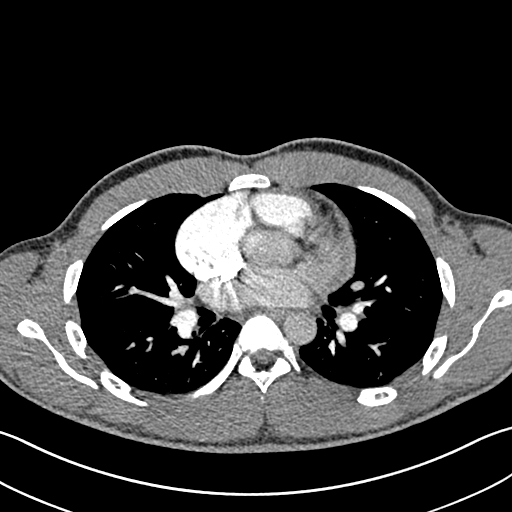
[im 158/279  lung]
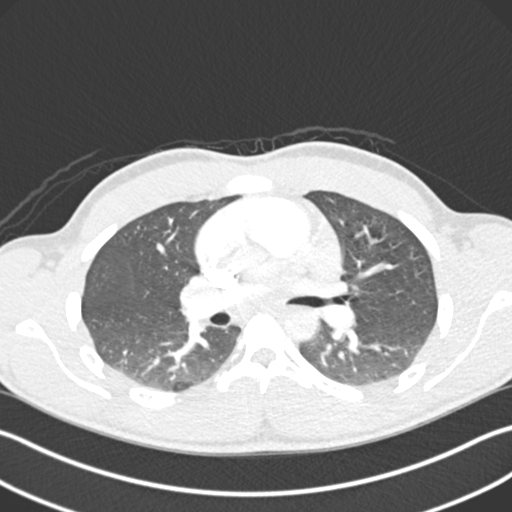
[im 170/279  soft-tissue]
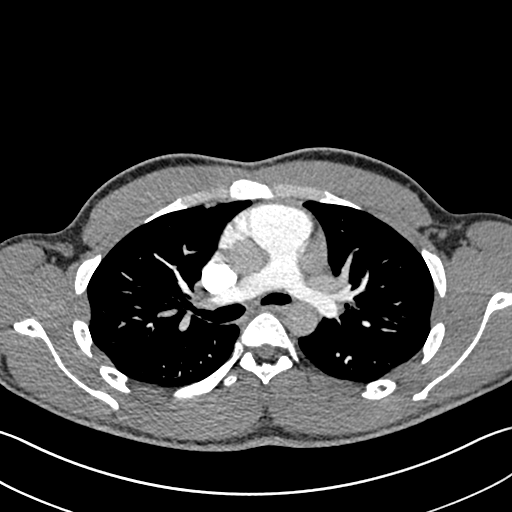
[im 194/279  lung]
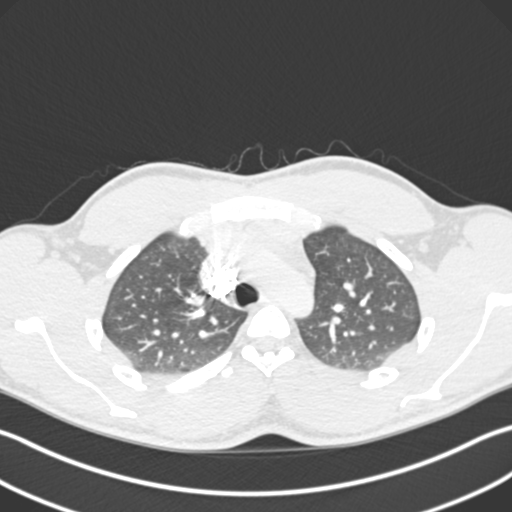
[im 206/279  soft-tissue]
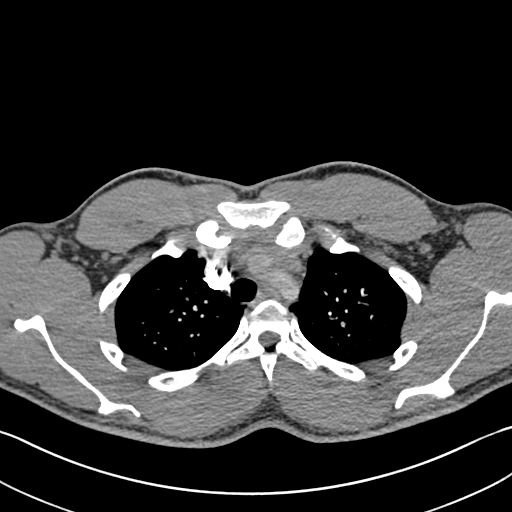
[im 230/279  lung]
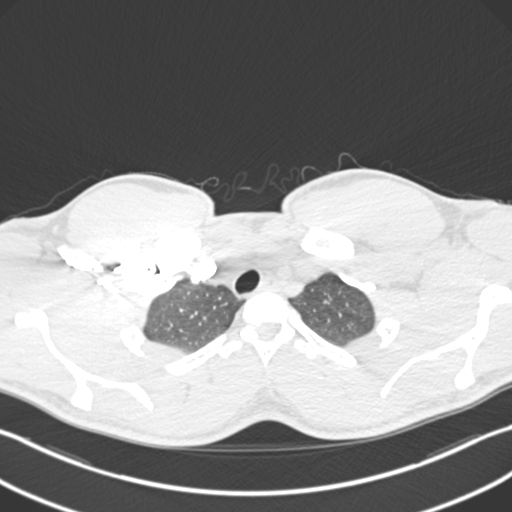
[im 242/279  soft-tissue]
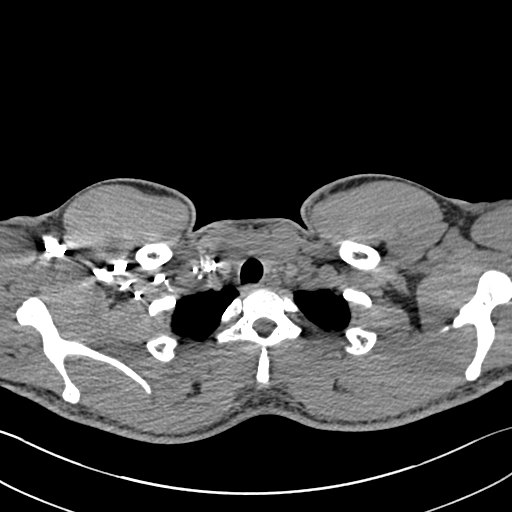
[im 266/279  lung]
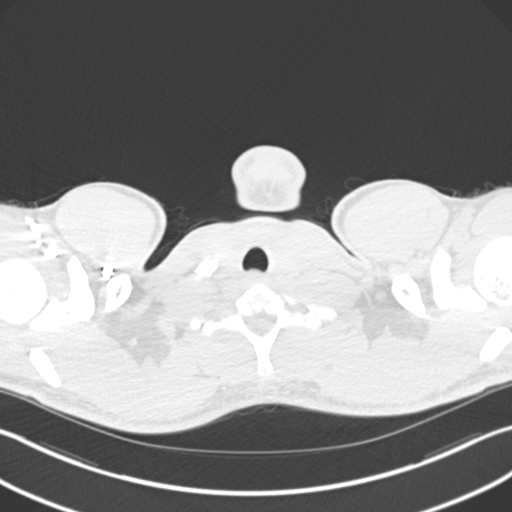

[Series 7: coronal mpr · coronal · 0.60mm/px · 3 of 84 slices shown]
[im 21/84  soft-tissue]
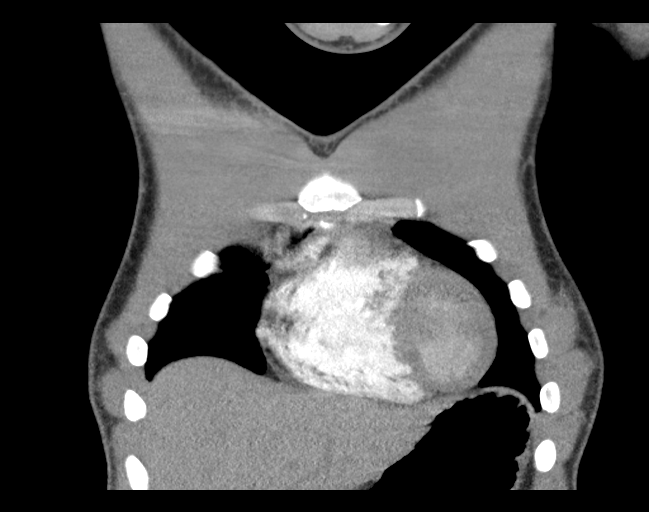
[im 42/84  soft-tissue]
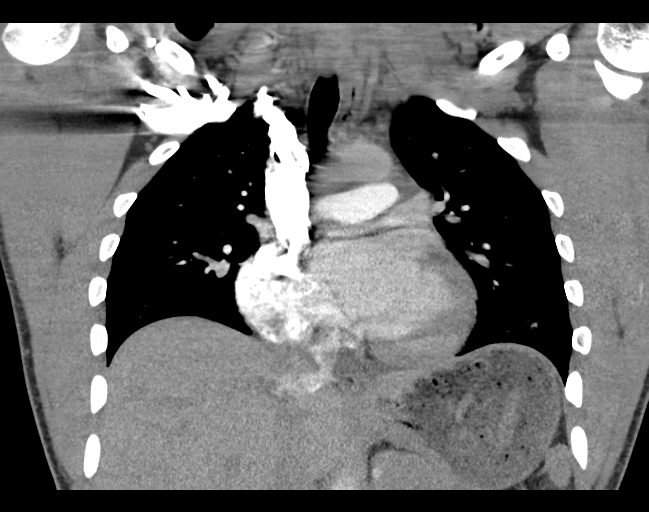
[im 63/84  soft-tissue]
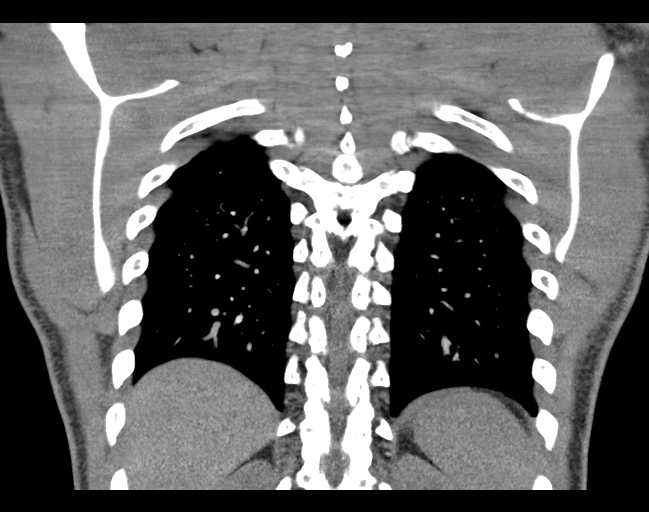

[18 of 46 positions shown; findings below may reference images not displayed]

FINDINGS: Cardiovascular: Contrast injection is sufficient to demonstrate
satisfactory opacification of the pulmonary arteries to the
segmental level. There is no pulmonary embolus or evidence of right
heart strain. The size of the main pulmonary artery is normal. Mild
cardiomegaly. The course and caliber of the aorta are normal. There
is no atherosclerotic calcification. Opacification decreased due to
pulmonary arterial phase contrast bolus timing.

Mediastinum/Nodes:

-- No mediastinal lymphadenopathy. There is retrosternal soft
tissues favored to represent residual thymic tissue.

-- No hilar lymphadenopathy.

-- No axillary lymphadenopathy.

-- No supraclavicular lymphadenopathy.

-- Normal thyroid gland where visualized.

-  Unremarkable esophagus.

Lungs/Pleura: Airways are patent. No pleural effusion, lobar
consolidation, pneumothorax or pulmonary infarction.

Upper Abdomen: Contrast bolus timing is not optimized for evaluation
of the abdominal organs. The visualized portions of the organs of
the upper abdomen are normal.

Musculoskeletal: No chest wall abnormality. No bony spinal canal
stenosis.

Review of the MIP images confirms the above findings.
IMPRESSION: 1. No evidence of pulmonary embolism or other acute intrathoracic
process.
2. Mild cardiomegaly.

## 2022-11-17 ENCOUNTER — Encounter: Payer: Self-pay | Admitting: Medical

## 2022-11-17 ENCOUNTER — Other Ambulatory Visit: Payer: Self-pay

## 2022-11-17 ENCOUNTER — Ambulatory Visit (INDEPENDENT_AMBULATORY_CARE_PROVIDER_SITE_OTHER): Payer: BC Managed Care – PPO | Admitting: Medical

## 2022-11-17 VITALS — BP 122/81 | HR 76 | Temp 97.3°F | Ht 73.62 in | Wt 223.0 lb

## 2022-11-17 DIAGNOSIS — Z202 Contact with and (suspected) exposure to infections with a predominantly sexual mode of transmission: Secondary | ICD-10-CM | POA: Diagnosis not present

## 2022-11-17 MED ORDER — DOXYCYCLINE HYCLATE 100 MG PO TABS: 100.0000 mg | ORAL_TABLET | Freq: Two times a day (BID) | ORAL | 0 refills | Status: AC

## 2022-11-17 MED ORDER — CEFTRIAXONE SODIUM 500 MG IJ SOLR
500.0000 mg | Freq: Once | INTRAMUSCULAR | Status: AC
  Administered 2022-11-17: 500 mg via INTRAMUSCULAR

## 2022-11-17 NOTE — Progress Notes (Signed)
[place] 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  His girlfriend of 8 months was recently diagnosed with Chlamydia and Gonorrhea. He started to notice penile discharge about a week ago. No dysuria, scrotal/testicular pain, abdominal pain, fever or chills.  Last had STI testing in August, urine test (negative). Believes he has had HIV/Syphilis testing previously.   Current Medication:  Outpatient Encounter Medications as of 11/17/2022  Medication Sig   apixaban (ELIQUIS) 5 MG TABS tablet Take 2 tablets morning and night for 7 days.  After 7 days take 1 tablet morning and night. (Patient not taking: Reported on 11/17/2022)   No facility-administered encounter medications on file as of 11/17/2022.      Medical History: Past Medical History:  Diagnosis Date   DVT (deep venous thrombosis) ([facility]) 07/2020     Vital Signs: BP 122/81   Pulse 76   Temp (!) 97.3 F (36.3 C) (Tympanic)   Ht 6' 1.62" (1.87 m)   Wt 223 lb (101.2 kg)   SpO2 98%   BMI 28.93 kg/m    Review of Systems  Constitutional:  Negative for chills and fever.  Gastrointestinal:  Negative for abdominal pain.  Genitourinary:  Positive for penile discharge. Negative for dysuria, frequency, genital sores, penile pain, penile swelling, scrotal swelling, testicular pain and urgency.    Physical Exam Vitals reviewed.  Constitutional:      General: He is not in acute distress.    Appearance: He is not ill-appearing.  Neurological:     Mental Status: He is alert.       Assessment/Plan: 1. Exposure to gonorrhea 2. Exposure to chlamydia Will  treat for Chlamydia and Gonorrhea based on exposure history. Offered HIV and Syphilis testing today, patient prefers to defer this. Discussed returning 4 weeks after completing antibiotic for STI testing if desired. Advised no sex for at least 1 week after completing antibiotic and not until partner has completed their treatment as well. Encouraged to use condoms consistently.   - cefTRIAXone (ROCEPHIN) injection 500 mg - doxycycline (VIBRA-TABS) 100 MG tablet; Take 1 tablet (100 mg total) by mouth 2 (two) times daily for 7 days. With food  Dispense: 14 tablet; Refill: 0     General Counseling: Perl verbalizes understanding of the findings of todays visit and agrees with plan of treatment. he has been encouraged to call the office with any questions or concerns that should arise related to todays visit.   Time spent:15 [place]on PA-C [place] 11/17/2022 1:01 PM

## 2022-11-17 NOTE — Patient Instructions (Signed)
-  Take complete course of antibiotics as prescribed.  Take with food.   -Do not have sex (even with a condom) for 1 week after finishing antibiotic and not until your partner has completed their antibiotic as well. -Consider returning for STI testing 4 weeks after you finish your antibiotics. -Use condoms every time you have sex to help prevent STI/STD. -Send MyChart message to provider or schedule return visit as needed for new/worsening symptoms or if symptoms do not resolve as discussed with antibiotics.

## 2023-02-14 ENCOUNTER — Ambulatory Visit: Payer: BC Managed Care – PPO | Admitting: Medical

## 2023-02-15 ENCOUNTER — Ambulatory Visit: Payer: BC Managed Care – PPO | Admitting: Medical
